# Patient Record
Sex: Male | Born: 1960 | Race: White | Hispanic: No | Marital: Married | Smoking: Never smoker
Health system: Southern US, Community
[De-identification: ages and names within clinical notes are randomized; demographics above are authoritative.]

## PROBLEM LIST (undated history)

## (undated) DIAGNOSIS — E559 Vitamin D deficiency, unspecified: Secondary | ICD-10-CM

## (undated) DIAGNOSIS — G4730 Sleep apnea, unspecified: Secondary | ICD-10-CM

## (undated) DIAGNOSIS — K760 Fatty (change of) liver, not elsewhere classified: Secondary | ICD-10-CM

## (undated) DIAGNOSIS — M109 Gout, unspecified: Secondary | ICD-10-CM

## (undated) DIAGNOSIS — M1990 Unspecified osteoarthritis, unspecified site: Secondary | ICD-10-CM

## (undated) DIAGNOSIS — E785 Hyperlipidemia, unspecified: Secondary | ICD-10-CM

## (undated) DIAGNOSIS — E669 Obesity, unspecified: Secondary | ICD-10-CM

## (undated) HISTORY — DX: Obesity, unspecified: E66.9

## (undated) HISTORY — DX: Unspecified osteoarthritis, unspecified site: M19.90

## (undated) HISTORY — DX: Hyperlipidemia, unspecified: E78.5

## (undated) HISTORY — DX: Gout, unspecified: M10.9

## (undated) HISTORY — PX: ORIF ACETABULAR FRACTURE: SHX5029

## (undated) HISTORY — PX: FRACTURE SURGERY: SHX138

## (undated) HISTORY — DX: Sleep apnea, unspecified: G47.30

## (undated) HISTORY — PX: HERNIA REPAIR: SHX51

## (undated) HISTORY — DX: Vitamin D deficiency, unspecified: E55.9

---

## 1997-08-20 ENCOUNTER — Other Ambulatory Visit: Admission: RE | Admit: 1997-08-20 | Discharge: 1997-08-20 | Payer: Self-pay | Admitting: *Deleted

## 2004-06-11 ENCOUNTER — Ambulatory Visit: Payer: Self-pay | Admitting: Internal Medicine

## 2010-06-02 ENCOUNTER — Ambulatory Visit: Payer: Self-pay | Admitting: Unknown Physician Specialty

## 2010-06-05 LAB — PATHOLOGY REPORT

## 2010-08-13 ENCOUNTER — Ambulatory Visit: Payer: Self-pay | Admitting: Anesthesiology

## 2010-08-15 ENCOUNTER — Ambulatory Visit: Payer: Self-pay | Admitting: Podiatry

## 2017-02-18 DIAGNOSIS — M1A00X0 Idiopathic chronic gout, unspecified site, without tophus (tophi): Secondary | ICD-10-CM | POA: Diagnosis not present

## 2017-02-18 DIAGNOSIS — G4730 Sleep apnea, unspecified: Secondary | ICD-10-CM | POA: Diagnosis not present

## 2017-02-18 DIAGNOSIS — M1991 Primary osteoarthritis, unspecified site: Secondary | ICD-10-CM | POA: Diagnosis not present

## 2017-02-18 DIAGNOSIS — E7849 Other hyperlipidemia: Secondary | ICD-10-CM | POA: Diagnosis not present

## 2017-02-18 DIAGNOSIS — Z125 Encounter for screening for malignant neoplasm of prostate: Secondary | ICD-10-CM | POA: Diagnosis not present

## 2017-02-25 DIAGNOSIS — M1991 Primary osteoarthritis, unspecified site: Secondary | ICD-10-CM | POA: Diagnosis not present

## 2017-02-25 DIAGNOSIS — E7849 Other hyperlipidemia: Secondary | ICD-10-CM | POA: Diagnosis not present

## 2017-02-25 DIAGNOSIS — Z0000 Encounter for general adult medical examination without abnormal findings: Secondary | ICD-10-CM | POA: Diagnosis not present

## 2017-02-25 DIAGNOSIS — G4730 Sleep apnea, unspecified: Secondary | ICD-10-CM | POA: Diagnosis not present

## 2017-03-10 DIAGNOSIS — L7211 Pilar cyst: Secondary | ICD-10-CM | POA: Diagnosis not present

## 2017-03-19 ENCOUNTER — Encounter (INDEPENDENT_AMBULATORY_CARE_PROVIDER_SITE_OTHER): Payer: Self-pay | Admitting: Vascular Surgery

## 2017-03-19 ENCOUNTER — Ambulatory Visit (INDEPENDENT_AMBULATORY_CARE_PROVIDER_SITE_OTHER): Payer: BLUE CROSS/BLUE SHIELD | Admitting: Vascular Surgery

## 2017-03-19 DIAGNOSIS — R600 Localized edema: Secondary | ICD-10-CM | POA: Diagnosis not present

## 2017-03-19 NOTE — Progress Notes (Signed)
Subjective:    Patient ID: Albert Fisher, male    DOB: 1960/05/20, 56 y.o.   MRN: [id number] Chief Complaint  Patient presents with  . Leg Swelling    Left leg edema   Presents as a new patient referred by Dr. Caryl Comes for evaluation of venous stasis.  The patient endorses a year long history of left lower extremity edema.  The patient states he sustained a trauma to the front of his left shin about a year ago.  Since this trauma, the patient has noticed his left lower extremity swells.  Notes the swelling is worse towards the end of the day.  The swelling is associated with some discomfort.  At this time, the patient does not engage in any conservative therapy.  The patient denies any surgery to the lower extremity.  The patient denies any DVT history to the lower extremity the patient denies any claudication-like symptoms, rest pain or ulceration to the lower extremity.  The patient denies any fever, nausea or vomiting.   Review of Systems  Constitutional: Negative.   HENT: Negative.   Eyes: Negative.   Respiratory: Negative.   Cardiovascular:       Left lower extremity swelling  Gastrointestinal: Negative.   Endocrine: Negative.   Genitourinary: Negative.   Musculoskeletal: Negative.   Skin: Negative.   Allergic/Immunologic: Negative.   Neurological: Negative.   Hematological: Negative.   Psychiatric/Behavioral: Negative.       Objective:   Physical Exam  Constitutional: He is oriented to person, place, and time. He appears well-developed and well-nourished. No distress.  HENT:  Head: Normocephalic and atraumatic.  Eyes: Conjunctivae are normal. Pupils are equal, round, and reactive to light.  Neck: Normal range of motion.  Cardiovascular: Normal rate, regular rhythm, normal heart sounds and intact distal pulses.  Pulses:      Radial pulses are 2+ on the right side, and 2+ on the left side.  Hard to palpate pedal pulses due to edema and body habitus.  The bilateral feet are  warm.  Pulmonary/Chest: Effort normal and breath sounds normal.  Musculoskeletal: Normal range of motion. He exhibits edema (Moderate left lower nonpitting edema noted.  Right lower extremity edema is mild).  Neurological: He is alert and oriented to person, place, and time.  Skin: Skin is warm and dry. He is not diaphoretic.  There is mild stasis dermatitis to the bilateral lower extremity.  There is no cellulitis.  There is no skin changes.  Psychiatric: He has a normal mood and affect. His behavior is normal. Judgment and thought content normal.  Vitals reviewed.  BP (!) 148/90   Pulse 71   Resp 17   Ht 5\' 11"  (1.803 m)   Wt 300 lb (136.1 kg)   BMI 41.84 kg/m   Past Medical History:  Diagnosis Date  . Gout   . Hyperlipidemia   . Obesity   . Osteoarthritis   . Sleep apnea   . Vitamin D deficiency    Social History   Socioeconomic History  . Marital status: Married    Spouse name: Not on file  . Number of children: 0  . Years of education: Not on file  . Highest education level: Not on file  Social Needs  . Financial resource strain: Not on file  . Food insecurity - worry: Not on file  . Food insecurity - inability: Not on file  . Transportation needs - medical: Not on file  . Transportation needs - non-medical:  Not on file  Occupational History  . Not on file  Tobacco Use  . Smoking status: Never Smoker  . Smokeless tobacco: Never Used  Substance and Sexual Activity  . Alcohol use: No    Frequency: Never  . Drug use: No  . Sexual activity: No  Other Topics Concern  . Not on file  Social History Narrative  . Not on file   Past Surgical History:  Procedure Laterality Date  . HERNIA REPAIR    . ORIF ACETABULAR FRACTURE Left    5th metatarsal   Family History  Problem Relation Age of Onset  . Hypertension Mother   . Irritable bowel syndrome Father   . Gout Brother    No Known Allergies     Assessment & Plan:  Presents as a new patient referred by  Dr. Caryl Comes for evaluation of venous stasis.  The patient endorses a year long history of left lower extremity edema.  The patient states he sustained a trauma to the front of his left shin about a year ago.  Since this trauma, the patient has noticed his left lower extremity swells.  Notes the swelling is worse towards the end of the day.  The swelling is associated with some discomfort.  At this time, the patient does not engage in any conservative therapy.  The patient denies any surgery to the lower extremity.  The patient denies any DVT history to the lower extremity the patient denies any claudication-like symptoms, rest pain or ulceration to the lower extremity.  The patient denies any fever, nausea or vomiting.  1. Bilateral lower extremity edema - New The patient was encouraged to wear graduated compression stockings (20-30 mmHg) on a daily basis. The patient was instructed to begin wearing the stockings first thing in the morning and removing them in the evening. The patient was instructed specifically not to sleep in the stockings. Prescription given. In addition, behavioral modification including elevation during the day will be initiated. Anti-inflammatories for pain. I would like to see the patient back in 1 month and have him undergo a bilateral venous duplex to rule out any contributing reflux. At this time, the patient would like to just start conservative therapy.  If conservative therapy does not improve his symptoms he will call our office and at that point we will move forward with a duplex. Follow-up as needed The patient was instructed to call the office in the interim if any worsening edema or ulcerations to the legs, feet or toes occurs. The patient expresses their understanding.  Current Outpatient Medications on File Prior to Visit  Medication Sig Dispense Refill  . allopurinol (ZYLOPRIM) 300 MG tablet Take 300 mg daily by mouth.    . montelukast (SINGULAIR) 10 MG tablet Take  10 mg 1 day or 1 dose by mouth.     No current facility-administered medications on file prior to visit.     There are no Patient Instructions on file for this visit. No Follow-up on file.   Albert Fisher A [name], PA-C

## 2017-05-27 DIAGNOSIS — L7211 Pilar cyst: Secondary | ICD-10-CM | POA: Diagnosis not present

## 2017-05-27 DIAGNOSIS — D170 Benign lipomatous neoplasm of skin and subcutaneous tissue of head, face and neck: Secondary | ICD-10-CM | POA: Diagnosis not present

## 2018-03-01 DIAGNOSIS — E559 Vitamin D deficiency, unspecified: Secondary | ICD-10-CM | POA: Diagnosis not present

## 2018-03-01 DIAGNOSIS — G4730 Sleep apnea, unspecified: Secondary | ICD-10-CM | POA: Diagnosis not present

## 2018-03-01 DIAGNOSIS — E7849 Other hyperlipidemia: Secondary | ICD-10-CM | POA: Diagnosis not present

## 2018-03-01 DIAGNOSIS — Z125 Encounter for screening for malignant neoplasm of prostate: Secondary | ICD-10-CM | POA: Diagnosis not present

## 2018-03-01 DIAGNOSIS — M1A00X0 Idiopathic chronic gout, unspecified site, without tophus (tophi): Secondary | ICD-10-CM | POA: Diagnosis not present

## 2018-03-08 DIAGNOSIS — M1991 Primary osteoarthritis, unspecified site: Secondary | ICD-10-CM | POA: Diagnosis not present

## 2018-03-08 DIAGNOSIS — E559 Vitamin D deficiency, unspecified: Secondary | ICD-10-CM | POA: Diagnosis not present

## 2018-03-08 DIAGNOSIS — M1A00X0 Idiopathic chronic gout, unspecified site, without tophus (tophi): Secondary | ICD-10-CM | POA: Diagnosis not present

## 2018-03-08 DIAGNOSIS — E7849 Other hyperlipidemia: Secondary | ICD-10-CM | POA: Diagnosis not present

## 2019-03-07 DIAGNOSIS — E7849 Other hyperlipidemia: Secondary | ICD-10-CM | POA: Diagnosis not present

## 2019-03-07 DIAGNOSIS — Z0000 Encounter for general adult medical examination without abnormal findings: Secondary | ICD-10-CM | POA: Diagnosis not present

## 2019-03-07 DIAGNOSIS — E559 Vitamin D deficiency, unspecified: Secondary | ICD-10-CM | POA: Diagnosis not present

## 2019-03-07 DIAGNOSIS — G4730 Sleep apnea, unspecified: Secondary | ICD-10-CM | POA: Diagnosis not present

## 2019-03-07 DIAGNOSIS — M1991 Primary osteoarthritis, unspecified site: Secondary | ICD-10-CM | POA: Diagnosis not present

## 2019-03-07 DIAGNOSIS — M1A00X0 Idiopathic chronic gout, unspecified site, without tophus (tophi): Secondary | ICD-10-CM | POA: Diagnosis not present

## 2019-03-14 DIAGNOSIS — M1991 Primary osteoarthritis, unspecified site: Secondary | ICD-10-CM | POA: Diagnosis not present

## 2019-03-14 DIAGNOSIS — Z0000 Encounter for general adult medical examination without abnormal findings: Secondary | ICD-10-CM | POA: Diagnosis not present

## 2019-03-14 DIAGNOSIS — E7849 Other hyperlipidemia: Secondary | ICD-10-CM | POA: Diagnosis not present

## 2019-03-14 DIAGNOSIS — G4730 Sleep apnea, unspecified: Secondary | ICD-10-CM | POA: Diagnosis not present

## 2019-04-13 DIAGNOSIS — R748 Abnormal levels of other serum enzymes: Secondary | ICD-10-CM | POA: Diagnosis not present

## 2019-05-02 ENCOUNTER — Other Ambulatory Visit: Payer: Self-pay | Admitting: Internal Medicine

## 2019-05-02 DIAGNOSIS — R748 Abnormal levels of other serum enzymes: Secondary | ICD-10-CM

## 2019-05-30 ENCOUNTER — Other Ambulatory Visit: Payer: Self-pay

## 2019-05-30 ENCOUNTER — Ambulatory Visit
Admission: RE | Admit: 2019-05-30 | Discharge: 2019-05-30 | Disposition: A | Discharge status: Home / Self Care | Payer: BLUE CROSS/BLUE SHIELD | Source: Ambulatory Visit | Attending: Internal Medicine | Admitting: Internal Medicine

## 2019-05-30 DIAGNOSIS — R748 Abnormal levels of other serum enzymes: Secondary | ICD-10-CM | POA: Insufficient documentation

## 2020-07-08 IMAGING — US US ABDOMEN LIMITED
1 series · 14 of 25 positions shown · non-contrast
Comparison: None.

CLINICAL DATA: Elevated liver enzymes

EXAM:
ULTRASOUND ABDOMEN LIMITED RIGHT UPPER QUADRANT

[Series 1: us abdomen limited · 14 of 34 slices shown]
[im 1/34]
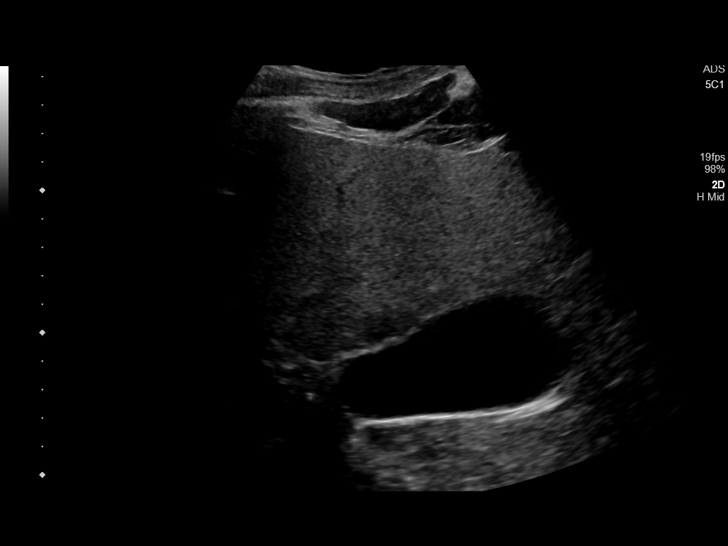
[im 3/34]
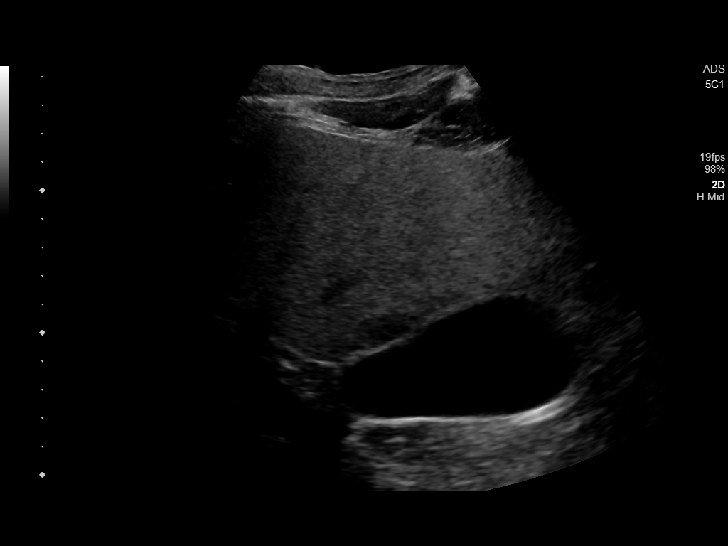
[im 6/34]
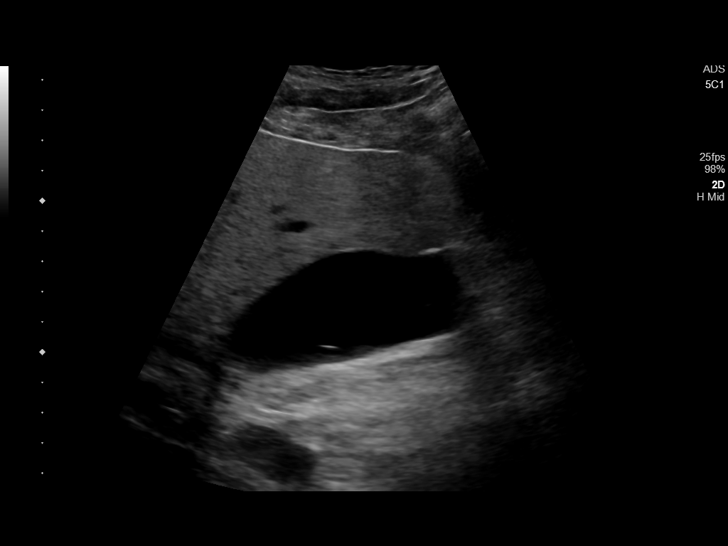
[im 9/34]
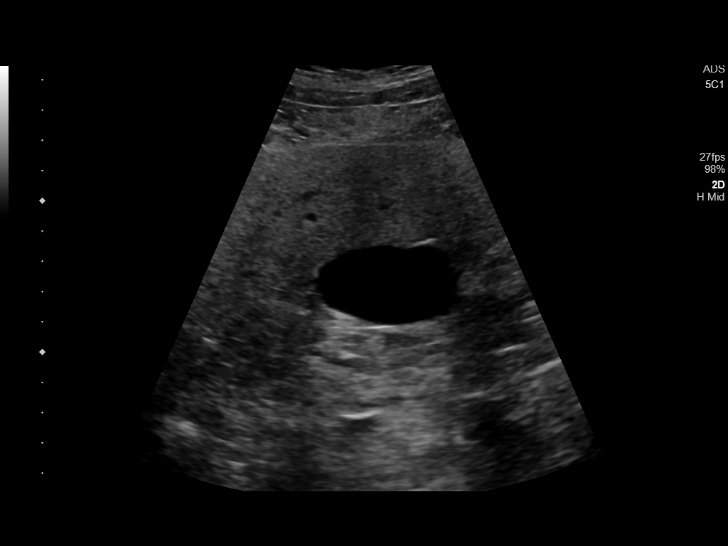
[im 12/34]
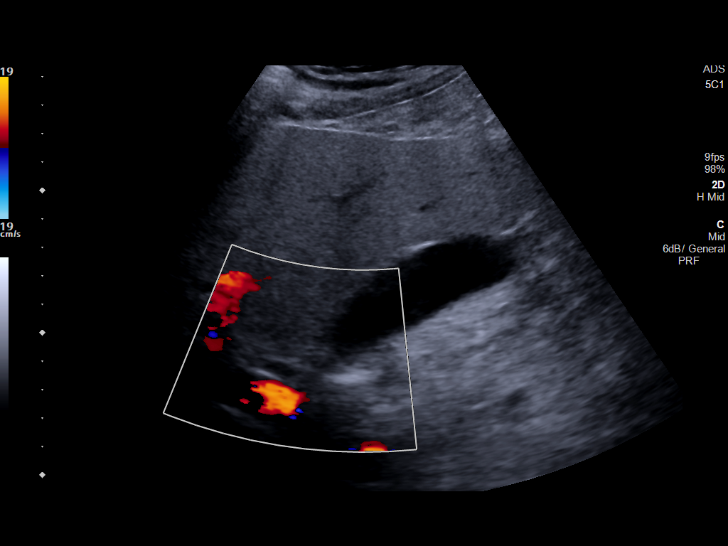
[im 13/34]
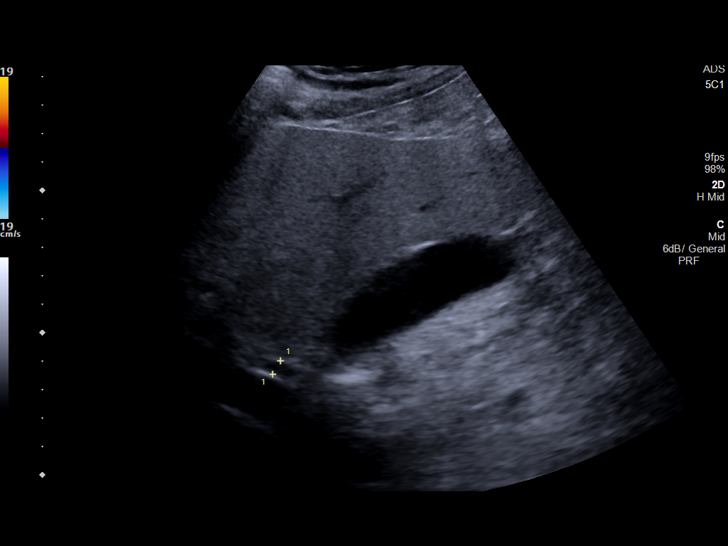
[im 16/34]
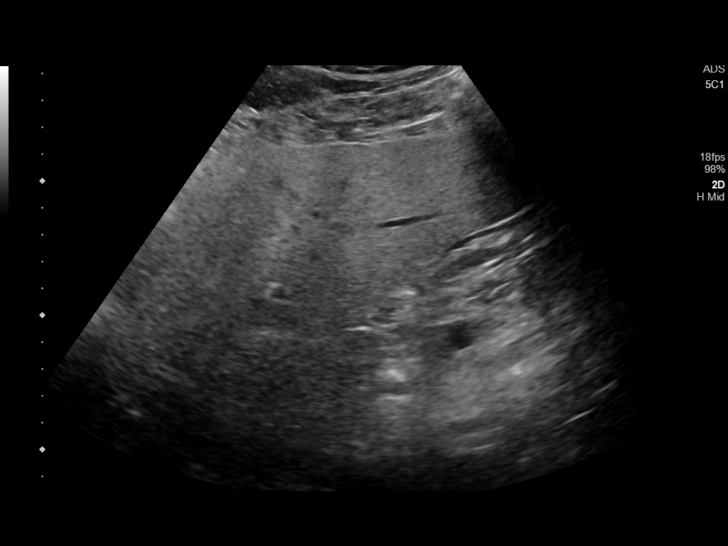
[im 18/34]
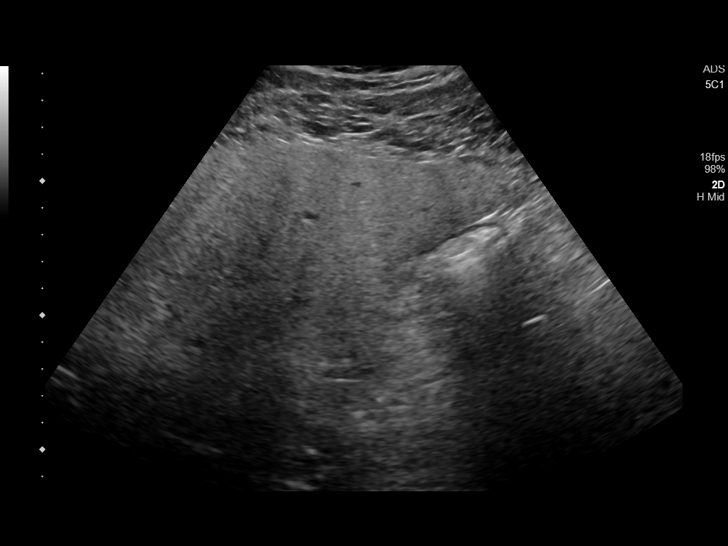
[im 21/34]
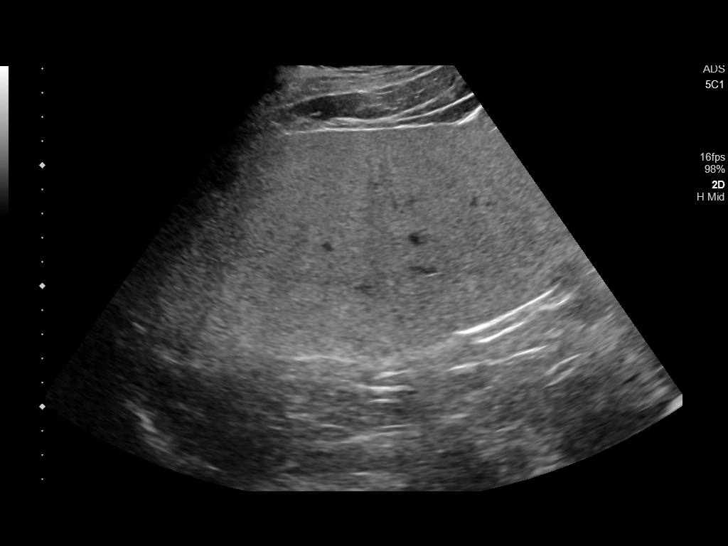
[im 23/34]
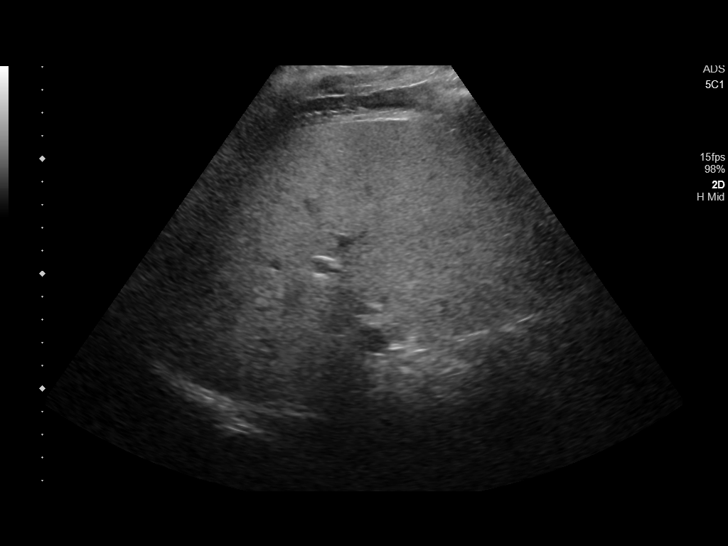
[im 25/34]
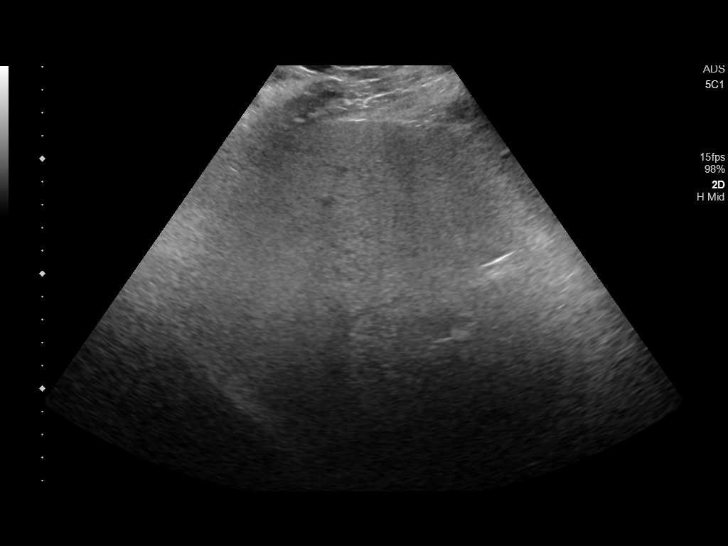
[im 28/34]
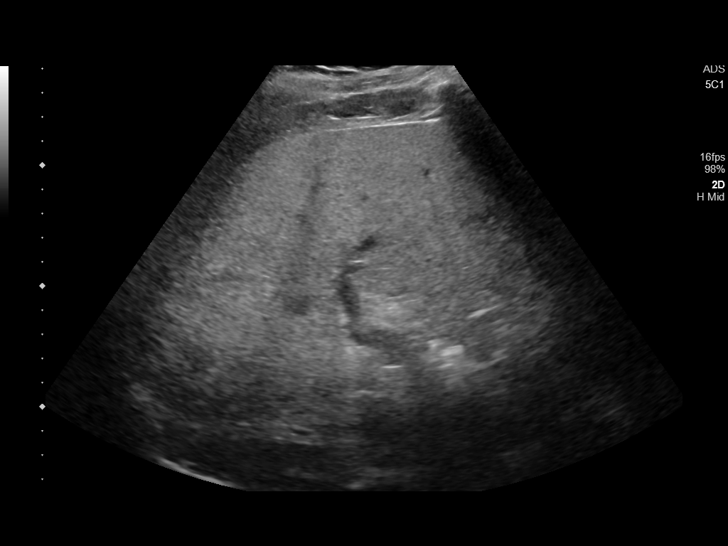
[im 31/34]
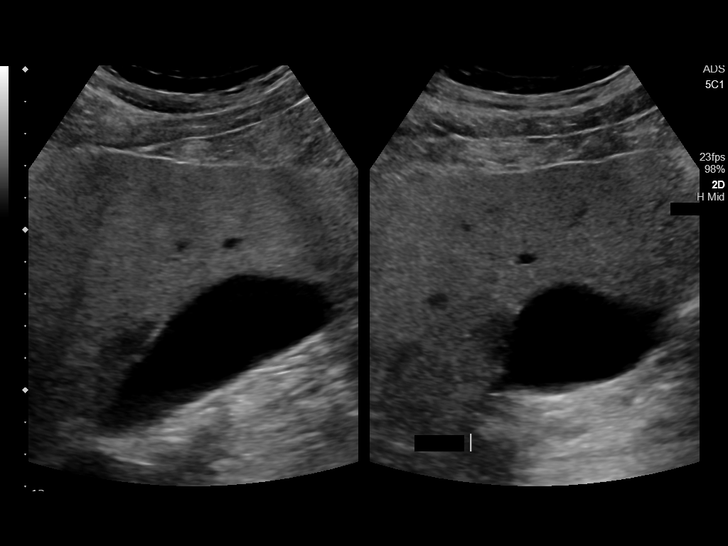
[im 34/34]
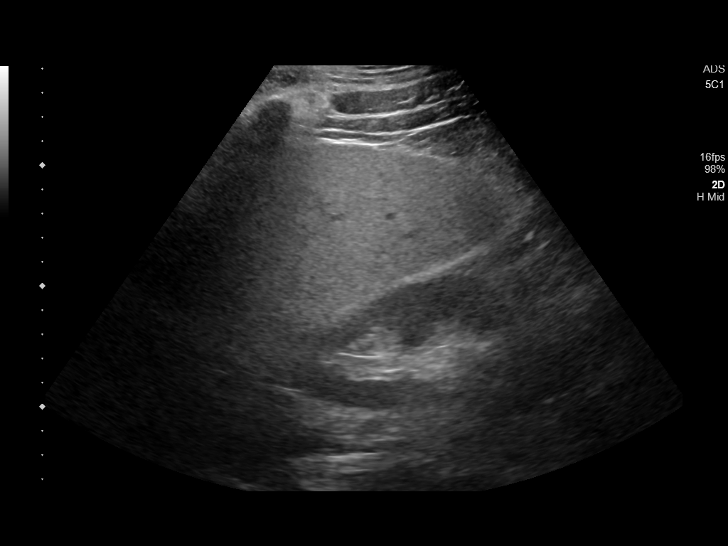

[14 of 25 positions shown; findings below may reference images not displayed]

FINDINGS: Gallbladder:

No gallstones or wall thickening visualized. There is no
pericholecystic fluid. No sonographic Murphy sign noted by
sonographer.

Common bile duct:

Diameter: 6 mm. No intrahepatic or extrahepatic biliary duct
dilatation.

Liver:

No focal lesion identified. Liver echogenicity is increased
diffusely. There is a hypoechoic area adjacent to the gallbladder
fossa measuring 1.5 x 1.8 x 1.3 cm which likely represents localized
focal fatty sparing. Portal vein is patent on color Doppler imaging
with normal direction of blood flow towards the liver.

Other: None.
IMPRESSION: 1. Diffuse increase in liver echogenicity, a finding indicative of
hepatic steatosis. Suspect focal fatty sparing near the gallbladder
fossa. Beyond suspected focal fatty sparing, no focal liver lesions
evident. Note that the sensitivity of ultrasound for detection of
focal liver lesions is diminished given this degree of increased
echogenicity from apparent hepatic steatosis.

2.  Study otherwise unremarkable.

## 2020-10-14 ENCOUNTER — Encounter: Payer: Self-pay | Admitting: *Deleted

## 2020-10-15 ENCOUNTER — Ambulatory Visit: Payer: BC Managed Care – PPO | Admitting: Certified Registered Nurse Anesthetist

## 2020-10-15 ENCOUNTER — Ambulatory Visit
Admission: RE | Admit: 2020-10-15 | Discharge: 2020-10-15 | Disposition: A | Discharge status: Home / Self Care | Payer: BC Managed Care – PPO | Attending: Gastroenterology | Admitting: Gastroenterology

## 2020-10-15 ENCOUNTER — Encounter
Admission: RE | Disposition: A | Discharge status: Home / Self Care | Payer: Self-pay | Source: Home / Self Care | Attending: Gastroenterology

## 2020-10-15 ENCOUNTER — Encounter: Payer: Self-pay | Admitting: *Deleted

## 2020-10-15 DIAGNOSIS — Z888 Allergy status to other drugs, medicaments and biological substances status: Secondary | ICD-10-CM | POA: Diagnosis not present

## 2020-10-15 DIAGNOSIS — E669 Obesity, unspecified: Secondary | ICD-10-CM | POA: Diagnosis not present

## 2020-10-15 DIAGNOSIS — D123 Benign neoplasm of transverse colon: Secondary | ICD-10-CM | POA: Insufficient documentation

## 2020-10-15 DIAGNOSIS — Z7982 Long term (current) use of aspirin: Secondary | ICD-10-CM | POA: Diagnosis not present

## 2020-10-15 DIAGNOSIS — Z1211 Encounter for screening for malignant neoplasm of colon: Secondary | ICD-10-CM | POA: Diagnosis present

## 2020-10-15 DIAGNOSIS — E785 Hyperlipidemia, unspecified: Secondary | ICD-10-CM | POA: Diagnosis not present

## 2020-10-15 DIAGNOSIS — D125 Benign neoplasm of sigmoid colon: Secondary | ICD-10-CM | POA: Diagnosis not present

## 2020-10-15 DIAGNOSIS — Z79899 Other long term (current) drug therapy: Secondary | ICD-10-CM | POA: Insufficient documentation

## 2020-10-15 DIAGNOSIS — K760 Fatty (change of) liver, not elsewhere classified: Secondary | ICD-10-CM | POA: Diagnosis not present

## 2020-10-15 DIAGNOSIS — Z6836 Body mass index (BMI) 36.0-36.9, adult: Secondary | ICD-10-CM | POA: Diagnosis not present

## 2020-10-15 DIAGNOSIS — G4730 Sleep apnea, unspecified: Secondary | ICD-10-CM | POA: Insufficient documentation

## 2020-10-15 DIAGNOSIS — K640 First degree hemorrhoids: Secondary | ICD-10-CM | POA: Insufficient documentation

## 2020-10-15 HISTORY — PX: COLONOSCOPY WITH PROPOFOL: SHX5780

## 2020-10-15 HISTORY — DX: Fatty (change of) liver, not elsewhere classified: K76.0

## 2020-10-15 SURGERY — COLONOSCOPY WITH PROPOFOL
Anesthesia: General

## 2020-10-15 MED ORDER — PROPOFOL 10 MG/ML IV BOLUS
INTRAVENOUS | Status: AC
  Filled 2020-10-15: qty 20

## 2020-10-15 MED ORDER — SODIUM CHLORIDE 0.9 % IV SOLN: INTRAVENOUS | Status: DC

## 2020-10-15 MED ORDER — PROPOFOL 500 MG/50ML IV EMUL
INTRAVENOUS | Status: DC | PRN
  Administered 2020-10-15: 125 ug/kg/min via INTRAVENOUS

## 2020-10-15 MED ORDER — SPOT INK MARKER SYRINGE KIT
PACK | SUBMUCOSAL | Status: DC | PRN
  Administered 2020-10-15: 1.5 mL via SUBMUCOSAL

## 2020-10-15 MED ORDER — PROPOFOL 10 MG/ML IV BOLUS
INTRAVENOUS | Status: DC | PRN
  Administered 2020-10-15: 50 mg via INTRAVENOUS

## 2020-10-15 MED ORDER — LIDOCAINE HCL (CARDIAC) PF 100 MG/5ML IV SOSY
PREFILLED_SYRINGE | INTRAVENOUS | Status: DC | PRN
  Administered 2020-10-15: 40 mg via INTRAVENOUS

## 2020-10-15 NOTE — Op Note (Signed)
[facility] Gastroenterology Patient Name: Albert Fisher Procedure Date: 10/15/2020 9:43 AM MRN: [id number] Account #: [id number] Date of Birth: [birth year] Admit Type: Outpatient Age: 60 Room: [facility] ENDO ROOM 2 Gender: Male Note Status: Finalized Procedure:             Colonoscopy Indications:           Screening for colorectal malignant neoplasm Providers:             Andrey Farmer MD, MD Referring MD:          Ramonita Lab, MD (Referring MD) Medicines:             Monitored Anesthesia Care Complications:         No immediate complications. Estimated blood loss:                         Minimal. Procedure:             Pre-Anesthesia Assessment:                        - Prior to the procedure, a History and Physical was                         performed, and patient medications and allergies were                         reviewed. The patient is competent. The risks and                         benefits of the procedure and the sedation options and                         risks were discussed with the patient. All questions                         were answered and informed consent was obtained.                         Patient identification and proposed procedure were                         verified by the physician, the nurse, the anesthetist                         and the technician in the endoscopy suite. Mental                         Status Examination: alert and oriented. Airway                         Examination: normal oropharyngeal airway and neck                         mobility. Respiratory Examination: clear to                         auscultation. CV Examination: normal. Prophylactic  Antibiotics: The patient does not require prophylactic                         antibiotics. Prior Anticoagulants: The patient has                         taken no previous anticoagulant or antiplatelet                         agents. ASA Grade  Assessment: II - A patient with mild                         systemic disease. After reviewing the risks and                         benefits, the patient was deemed in satisfactory                         condition to undergo the procedure. The anesthesia                         plan was to use monitored anesthesia care (MAC).                         Immediately prior to administration of medications,                         the patient was re-assessed for adequacy to receive                         sedatives. The heart rate, respiratory rate, oxygen                         saturations, blood pressure, adequacy of pulmonary                         ventilation, and response to care were monitored                         throughout the procedure. The physical status of the                         patient was re-assessed after the procedure.                        After obtaining informed consent, the colonoscope was                         passed under direct vision. Throughout the procedure,                         the patient's blood pressure, pulse, and oxygen                         saturations were monitored continuously. The                         Colonoscope was introduced through the anus and  advanced to the the cecum, identified by appendiceal                         orifice and ileocecal valve. The colonoscopy was                         performed without difficulty. The patient tolerated                         the procedure well. The quality of the bowel                         preparation was good. Findings:      The perianal and digital rectal examinations were normal.      A 20 mm polyp was found in the sigmoid colon. The polyp was       pedunculated. The polyp was removed with a hot snare. Resection and       retrieval were complete. Area was tattooed with an injection of [place]       ink. To prevent bleeding post-intervention, one hemostatic clip was        successfully placed. There was no bleeding at the end of the procedure.       Biopsies were taken with a cold forceps for histology of the residual       stalk. Estimated blood loss was minimal.      A 7 mm polyp was found in the transverse colon. The polyp was sessile.       The polyp was removed with a hot snare. Resection and retrieval were       complete. Estimated blood loss was minimal.      Internal hemorrhoids were found during retroflexion. The hemorrhoids       were Grade I (internal hemorrhoids that do not prolapse).      The exam was otherwise without abnormality on direct and retroflexion       views. Impression:            - One 20 mm polyp in the sigmoid colon, removed with a                         hot snare. Resected and retrieved. Tattooed. Clip was                         placed. Biopsied.                        - One 7 mm polyp in the transverse colon, removed with                         a hot snare. Resected and retrieved.                        - Internal hemorrhoids.                        - The examination was otherwise normal on direct and                         retroflexion views. Recommendation:        - Discharge patient to home.                        -  Resume previous diet.                        - Continue present medications.                        - Await pathology results.                        - Repeat colonoscopy for surveillance based on                         pathology results.                        - Return to referring physician as previously                         scheduled. Procedure Code(s):     --- Professional ---                        [phone], Colonoscopy, flexible; with removal of                         tumor(s), polyp(s), or other lesion(s) by snare                         technique                        [id number], Colonoscopy, flexible; with directed submucosal                         injection(s), any substance Diagnosis  Code(s):     --- Professional ---                        Z12.11, Encounter for screening for malignant neoplasm                         of colon                        K63.5, Polyp of colon                        K64.0, First degree hemorrhoids CPT copyright 2019 American Medical Association. All rights reserved. The codes documented in this report are preliminary and upon coder review may  be revised to meet current compliance requirements. Andrey Farmer MD, MD 10/15/2020 10:28:43 AM Number of Addenda: 0 Note Initiated On: 10/15/2020 9:43 AM Scope Withdrawal Time: 0 hours 5 minutes 30 seconds  Total Procedure Duration: 0 hours 31 minutes 3 seconds  Estimated Blood Loss:  Estimated blood loss was minimal.      [facility]

## 2020-10-15 NOTE — Transfer of Care (Signed)
Immediate Anesthesia Transfer of Care Note  Patient: Albert Fisher  Procedure(s) Performed: COLONOSCOPY WITH PROPOFOL (N/A )  Patient Location: PACU  Anesthesia Type:General  Level of Consciousness: awake, alert  and oriented  Airway & Oxygen Therapy: Patient Spontanous Breathing and Patient connected to nasal cannula oxygen  Post-op Assessment: Report given to RN and Post -op Vital signs reviewed and stable  Post vital signs: Reviewed and stable  Last Vitals:  Vitals Value Taken Time  BP 133/86 10/15/20 1030  Temp 36.4 C 10/15/20 1029  Pulse 78 10/15/20 1032  Resp 22 10/15/20 1032  SpO2 96 % 10/15/20 1032  Vitals shown include unvalidated device data.  Last Pain:  Vitals:   10/15/20 1029  TempSrc: Temporal  PainSc: 0-No pain         Complications: No complications documented.

## 2020-10-15 NOTE — Anesthesia Postprocedure Evaluation (Signed)
Anesthesia Post Note  Patient: Albert Fisher  Procedure(s) Performed: COLONOSCOPY WITH PROPOFOL (N/A )  Patient location during evaluation: Endoscopy Anesthesia Type: General Level of consciousness: awake and alert Pain management: pain level controlled Vital Signs Assessment: post-procedure vital signs reviewed and stable Respiratory status: spontaneous breathing, nonlabored ventilation, respiratory function stable and patient connected to nasal cannula oxygen Cardiovascular status: blood pressure returned to baseline and stable Postop Assessment: no apparent nausea or vomiting Anesthetic complications: no   No complications documented.   Last Vitals:  Vitals:   10/15/20 1039 10/15/20 1049  BP: (!) 137/92 136/86  Pulse:    Resp:    Temp:    SpO2:      Last Pain:  Vitals:   10/15/20 1049  TempSrc:   PainSc: 0-No pain                 Arita Miss

## 2020-10-15 NOTE — H&P (Signed)
Outpatient short stay form Pre-procedure 10/15/2020 9:41 AM Raylene Miyamoto MD, MPH  Primary Physician: Dr. Caryl Comes  Reason for visit:  Screening  History of present illness:   60 y/o gentleman with history of obesity here for screening colonoscopy. No family history of GI malignancies. No blood thinners. No abdominal surgeries. No new symptoms.    Current Facility-Administered Medications:  .  0.9 %  sodium chloride infusion, , Intravenous, Continuous, Almon Whitford, Hilton Cork, MD, Last Rate: 20 mL/hr at 10/15/20 0935, New Bag at 10/15/20 0935  Medications Prior to Admission  Medication Sig Dispense Refill Last Dose  . aspirin EC 81 MG tablet Take 81 mg by mouth daily. Swallow whole.   Past Week at Unknown time  . azelastine (ASTELIN) 0.1 % nasal spray Place 1 spray into both nostrils 2 (two) times daily. Use in each nostril as directed     . montelukast (SINGULAIR) 10 MG tablet Take 10 mg 1 day or 1 dose by mouth.   10/14/2020 at Unknown time  . Vitamin D, Ergocalciferol, (DRISDOL) 1.25 MG (50000 UNIT) CAPS capsule Take 50,000 Units by mouth every 7 (seven) days.   Past Month at Unknown time  . allopurinol (ZYLOPRIM) 300 MG tablet Take 300 mg daily by mouth.   10/13/2020     Allergies  Allergen Reactions  . Fenofibrate Other (See Comments)    Muscle pain     Past Medical History:  Diagnosis Date  . Gout   . Hepatic steatosis   . Hyperlipidemia   . Obesity   . Osteoarthritis   . Sleep apnea   . Vitamin D deficiency     Review of systems:  Otherwise negative.    Physical Exam  Gen: Alert, oriented. Appears stated age.  HEENT: PERRLA. Lungs: No respiratory distress CV: RRR Abd: soft, benign, no masses Ext: No edema    Planned procedures: Proceed with colonoscopy. The patient understands the nature of the planned procedure, indications, risks, alternatives and potential complications including but not limited to bleeding, infection, perforation, damage to internal organs  and possible oversedation/side effects from anesthesia. The patient agrees and gives consent to proceed.  Please refer to procedure notes for findings, recommendations and patient disposition/instructions.     Raylene Miyamoto MD, MPH Gastroenterology 10/15/2020  9:41 AM

## 2020-10-15 NOTE — Anesthesia Preprocedure Evaluation (Signed)
Anesthesia Evaluation  Patient identified by MRN, date of birth, ID band Patient awake    Reviewed: Allergy & Precautions, NPO status , Patient's Chart, lab work & pertinent test results  History of Anesthesia Complications Negative for: history of anesthetic complications  Airway Mallampati: III  TM Distance: >3 FB Neck ROM: Full    Dental no notable dental hx. (+) Teeth Intact   Pulmonary sleep apnea and Continuous Positive Airway Pressure Ventilation , neg COPD, Patient abstained from smoking.Not current smoker,    Pulmonary exam normal breath sounds clear to auscultation       Cardiovascular Exercise Tolerance: Good METS(-) hypertension(-) CAD and (-) Past MI negative cardio ROS  (-) dysrhythmias  Rhythm:Regular Rate:Normal - Systolic murmurs    Neuro/Psych negative neurological ROS  negative psych ROS   GI/Hepatic neg GERD  ,(+)     (-) substance abuse  ,   Endo/Other  neg diabetes  Renal/GU negative Renal ROS     Musculoskeletal   Abdominal   Peds  Hematology   Anesthesia Other Findings Past Medical History: No date: Gout No date: Hepatic steatosis No date: Hyperlipidemia No date: Obesity No date: Osteoarthritis No date: Sleep apnea No date: Vitamin D deficiency  Reproductive/Obstetrics                             Anesthesia Physical Anesthesia Plan  ASA: II  Anesthesia Plan: General   Post-op Pain Management:    Induction: Intravenous  PONV Risk Score and Plan: 2 and Ondansetron, Propofol infusion and TIVA  Airway Management Planned: Nasal Cannula  Additional Equipment: None  Intra-op Plan:   Post-operative Plan:   Informed Consent: I have reviewed the patients History and Physical, chart, labs and discussed the procedure including the risks, benefits and alternatives for the proposed anesthesia with the patient or authorized representative who has indicated  his/her understanding and acceptance.     Dental advisory given  Plan Discussed with: CRNA and Surgeon  Anesthesia Plan Comments: (Discussed risks of anesthesia with patient, including possibility of difficulty with spontaneous ventilation under anesthesia necessitating airway intervention, PONV, and rare risks such as cardiac or respiratory or neurological events. Patient understands.)        Anesthesia Quick Evaluation

## 2020-10-15 NOTE — Interval H&P Note (Signed)
History and Physical Interval Note:  10/15/2020 9:43 AM  Albert Fisher  has presented today for surgery, with the diagnosis of SCREENING.  The various methods of treatment have been discussed with the patient and family. After consideration of risks, benefits and other options for treatment, the patient has consented to  Procedure(s): COLONOSCOPY WITH PROPOFOL (N/A) as a surgical intervention.  The patient's history has been reviewed, patient examined, no change in status, stable for surgery.  I have reviewed the patient's chart and labs.  Questions were answered to the patient's satisfaction.     Lesly Rubenstein  Ok to proceed with colonoscopy

## 2020-10-16 ENCOUNTER — Encounter: Payer: Self-pay | Admitting: Gastroenterology

## 2020-10-16 LAB — SURGICAL PATHOLOGY

## 2021-12-08 ENCOUNTER — Ambulatory Visit: Payer: Self-pay | Admitting: Surgery

## 2021-12-08 NOTE — H&P (Signed)
Subjective:  CC: Recurrent umbilical hernia [G62.6]  HPI:  Albert Fisher is a 61 y.o. male who was referred by Cheryll Cockayne, MD for evaluation of above. Symptoms were first noted a few weeks ago. Pain is discomfort, confined to the periumbilical area, without radiation.  Associated with nothing, exacerbated by nothing  Lump is reducible.   Hx of repair in past with mesh per patient report.   Past Medical History:  has a past medical history of Gout, Hepatic steatosis (05/31/2019), HLD (hyperlipidemia), Obesity, Osteoarthritis, Sleep apnea with use of continuous positive airway pressure (CPAP), and Vitamin D deficiency disease.  Past Surgical History:  Past Surgical History: Procedure Laterality Date  COLONOSCOPY  06/02/2010  Dr. Kennis Carina @ [facility] - Hyperplastic Polyp, rpt 10 yrs per RTE  COLONOSCOPY  10/15/2020  Tubular adenomas/29yr/[facility]  HERNIA REPAIR    ORIF left 5th metatarsal fracture     Family History: family history includes Gout in his brother; High blood pressure (Hypertension) in his mother; Irritable bowel syndrome in his father.  Social History:  reports that he has never smoked. He has never used smokeless tobacco. He reports that he does not drink alcohol. No history on file for drug use.  Current Medications: has a current medication list which includes the following prescription(s): allopurinol, aspirin, ergocalciferol (vitamin d2), montelukast, prednisone, and sodium, potassium, and magnesium.  Allergies:  Allergies as of 12/08/2021 - Reviewed 12/08/2021 Allergen Reaction Noted  Fenofibrate Muscle Pain    ROS:  A 15 point review of systems was performed and pertinent positives and negatives noted in HPI   Objective:    BP (!) 153/83   Pulse 81   Ht 185.4 cm ('6\' 1"'$ )   Wt (!) 132 kg (291 lb)   BMI 38.39 kg/m   Constitutional :  Alert, cooperative, no distress Lymphatics/Throat:  Supple, no lymphadenopathy Respiratory:  clear to auscultation  bilaterally Cardiovascular:  regular rate and rhythm Gastrointestinal: soft, non-tender; bowel sounds normal; no masses,  no organomegaly. umbilical hernia noted.  moderate, incarcerated, no overlying skin changes, and rare TTP . Diastasis noted as well. Musculoskeletal: Steady gait and movement Skin: Cool and moist, periumbilical surgical scar Psychiatric: Normal affect, non-agitated, not confused     LABS:  N/a   RADS: N/a Assessment:      Recurrent umbilical hernia [[R48.5] pt concerned about strangulation and requesting proceeding with repair, understanding he is higher risk of recurrence at his current weight.  Plan:    1. Recurrent umbilical hernia [[I62.7]  Discussed the risk of surgery including recurrence, which can be up to 50% in the case of incisional or complex hernias, possible use of prosthetic materials (mesh) and the increased risk of mesh infxn if used, bleeding, chronic pain, post-op infxn, post-op SBO or ileus, and possible re-operation to address said risks. The risks of general anesthetic, if used, includes MI, CVA, sudden death or even reaction to anesthetic medications also discussed. Alternatives include continued observation.  Benefits include possible symptom relief, prevention of incarceration, strangulation, enlargement in size over time, and the risk of emergency surgery in the face of strangulation.   Typical post-op recovery time of 3-5 days with 2 weeks of activity restrictions were also discussed.  ED return precautions given for sudden increase in pain, size of hernia with accompanying fever, nausea, and/or vomiting.  The patient verbalized understanding and all questions were answered to the patient's satisfaction.   2. Patient has elected to proceed with surgical treatment. Procedure will  be scheduled. robotic assisted laparoscopic with mesh  labs/images/medications/previous chart entries reviewed personally and relevant changes/updates noted  above.

## 2021-12-08 NOTE — H&P (View-Only) (Signed)
Subjective:  CC: Recurrent umbilical hernia [G81.8]  HPI:  Albert Fisher is a 61 y.o. male who was referred by Cheryll Cockayne, MD for evaluation of above. Symptoms were first noted a few weeks ago. Pain is discomfort, confined to the periumbilical area, without radiation.  Associated with nothing, exacerbated by nothing  Lump is reducible.   Hx of repair in past with mesh per patient report.   Past Medical History:  has a past medical history of Gout, Hepatic steatosis (05/31/2019), HLD (hyperlipidemia), Obesity, Osteoarthritis, Sleep apnea with use of continuous positive airway pressure (CPAP), and Vitamin D deficiency disease.  Past Surgical History:  Past Surgical History: Procedure Laterality Date  COLONOSCOPY  06/02/2010  Dr. Kennis Carina @ [place] - Hyperplastic Polyp, rpt 10 yrs per RTE  COLONOSCOPY  10/15/2020  Tubular adenomas/21yr/[facility]  HERNIA REPAIR    ORIF left 5th metatarsal fracture     Family History: family history includes Gout in his brother; High blood pressure (Hypertension) in his mother; Irritable bowel syndrome in his father.  Social History:  reports that he has never smoked. He has never used smokeless tobacco. He reports that he does not drink alcohol. No history on file for drug use.  Current Medications: has a current medication list which includes the following prescription(s): allopurinol, aspirin, ergocalciferol (vitamin d2), montelukast, prednisone, and sodium, potassium, and magnesium.  Allergies:  Allergies as of 12/08/2021 - Reviewed 12/08/2021 Allergen Reaction Noted  Fenofibrate Muscle Pain    ROS:  A 15 point review of systems was performed and pertinent positives and negatives noted in HPI   Objective:    BP (!) 153/83   Pulse 81   Ht 185.4 cm ('6\' 1"'$ )   Wt (!) 132 kg (291 lb)   BMI 38.39 kg/m   Constitutional :  Alert, cooperative, no distress Lymphatics/Throat:  Supple, no lymphadenopathy Respiratory:  clear to auscultation  bilaterally Cardiovascular:  regular rate and rhythm Gastrointestinal: soft, non-tender; bowel sounds normal; no masses,  no organomegaly. umbilical hernia noted.  moderate, incarcerated, no overlying skin changes, and rare TTP . Diastasis noted as well. Musculoskeletal: Steady gait and movement Skin: Cool and moist, periumbilical surgical scar Psychiatric: Normal affect, non-agitated, not confused     LABS:  N/a   RADS: N/a Assessment:      Recurrent umbilical hernia [[H63.1] pt concerned about strangulation and requesting proceeding with repair, understanding he is higher risk of recurrence at his current weight.  Plan:    1. Recurrent umbilical hernia [[S97.0]  Discussed the risk of surgery including recurrence, which can be up to 50% in the case of incisional or complex hernias, possible use of prosthetic materials (mesh) and the increased risk of mesh infxn if used, bleeding, chronic pain, post-op infxn, post-op SBO or ileus, and possible re-operation to address said risks. The risks of general anesthetic, if used, includes MI, CVA, sudden death or even reaction to anesthetic medications also discussed. Alternatives include continued observation.  Benefits include possible symptom relief, prevention of incarceration, strangulation, enlargement in size over time, and the risk of emergency surgery in the face of strangulation.   Typical post-op recovery time of 3-5 days with 2 weeks of activity restrictions were also discussed.  ED return precautions given for sudden increase in pain, size of hernia with accompanying fever, nausea, and/or vomiting.  The patient verbalized understanding and all questions were answered to the patient's satisfaction.   2. Patient has elected to proceed with surgical treatment. Procedure will  be scheduled. robotic assisted laparoscopic with mesh  labs/images/medications/previous chart entries reviewed personally and relevant changes/updates noted  above.

## 2021-12-16 ENCOUNTER — Other Ambulatory Visit: Payer: Self-pay

## 2021-12-16 ENCOUNTER — Encounter
Admission: RE | Admit: 2021-12-16 | Discharge: 2021-12-16 | Disposition: A | Discharge status: Home / Self Care | Payer: BC Managed Care – PPO | Source: Ambulatory Visit | Attending: Surgery | Admitting: Surgery

## 2021-12-16 NOTE — Patient Instructions (Addendum)
Your procedure is scheduled on: 12/19/21 - Friday Report to the Registration Desk on the 1st floor of the [facility]. To find out your arrival time, please call [phone] between 1PM - 3PM on: 12/18/21 - Thursday If your arrival time is 6:00 am, do not arrive prior to that time as the [facility] entrance doors do not open until 6:00 am.  REMEMBER: Instructions that are not followed completely may result in serious medical risk, up to and including death; or upon the discretion of your surgeon and anesthesiologist your surgery may need to be rescheduled.  Do not eat food after midnight the night before surgery.  No gum chewing, lozengers or hard candies.  You may however, drink CLEAR liquids up to 2 hours before you are scheduled to arrive for your surgery. Do not drink anything within 2 hours of your scheduled arrival time.  Clear liquids include: - water  - apple juice without pulp - gatorade (not RED colors) - black coffee or tea (Do NOT add milk or creamers to the coffee or tea) Do NOT drink anything that is not on this list.  TAKE THESE MEDICATIONS THE MORNING OF SURGERY WITH A SIP OF WATER- NONE  One week prior to surgery: Stop Anti-inflammatories (NSAIDS) such as Advil, Aleve, Ibuprofen, Motrin, Naproxen, Naprosyn and Aspirin based products such as Excedrin, Goodys Powder, BC Powder.  Stop ANY OVER THE COUNTER supplements until after surgery.  You may take Tylenol if needed for pain up until the day of surgery.  No Alcohol for 24 hours before or after surgery.  No Smoking including e-cigarettes for 24 hours prior to surgery.  No chewable tobacco products for at least 6 hours prior to surgery.  No nicotine patches on the day of surgery.  Do not use any "recreational" drugs for at least a week prior to your surgery.  Please be advised that the combination of cocaine and anesthesia may have negative outcomes, up to and including death. If you test positive for  cocaine, your surgery will be cancelled.  On the morning of surgery brush your teeth with toothpaste and water, you may rinse your mouth with mouthwash if you wish. Do not swallow any toothpaste or mouthwash.  Use CHG Soap or wipes as directed on instruction sheet.  Do not wear jewelry, make-up, hairpins, clips or nail polish.  Do not wear lotions, powders, or perfumes.   Do not shave body from the neck down 48 hours prior to surgery just in case you cut yourself which could leave a site for infection.  Also, freshly shaved skin may become irritated if using the CHG soap.  Contact lenses, hearing aids and dentures may not be worn into surgery.  Do not bring valuables to the hospital. [facility] is not responsible for any missing/lost belongings or valuables.   Bring your C-PAP to the hospital with you in case you may have to spend the night.   Notify your doctor if there is any change in your medical condition (cold, fever, infection).  Wear comfortable clothing (specific to your surgery type) to the hospital.  After surgery, you can help prevent lung complications by doing breathing exercises.  Take deep breaths and cough every 1-2 hours. Your doctor may order a device called an Incentive Spirometer to help you take deep breaths. When coughing or sneezing, hold a pillow firmly against your incision with both hands. This is called "splinting." Doing this helps protect your incision. It also decreases belly discomfort.  If you are being admitted to the hospital overnight, leave your suitcase in the car. After surgery it may be brought to your room.  If you are being discharged the day of surgery, you will not be allowed to drive home. You will need a responsible adult (18 years or older) to drive you home and stay with you that night.   If you are taking public transportation, you will need to have a responsible adult (18 years or older) with you. Please confirm with your physician  that it is acceptable to use public transportation.   Please call the [facility] at [phone] if you have any questions about these instructions.  Surgery Visitation Policy:  Patients undergoing a surgery or procedure may have two family members or support persons with them as long as the person is not COVID-19 positive or experiencing its symptoms.   Inpatient Visitation:    Visiting hours are 7 a.m. to 8 p.m. Up to four visitors are allowed at one time in a patient room, including children. The visitors may rotate out with other people during the day. One designated support person (adult) may remain overnight.

## 2021-12-19 ENCOUNTER — Encounter: Payer: Self-pay | Admitting: Surgery

## 2021-12-19 ENCOUNTER — Ambulatory Visit
Admission: RE | Admit: 2021-12-19 | Discharge: 2021-12-19 | Disposition: A | Discharge status: Home / Self Care | Payer: BC Managed Care – PPO | Attending: Surgery | Admitting: Surgery

## 2021-12-19 ENCOUNTER — Encounter
Admission: RE | Disposition: A | Discharge status: Home / Self Care | Payer: Self-pay | Source: Home / Self Care | Attending: Surgery

## 2021-12-19 ENCOUNTER — Ambulatory Visit: Payer: BC Managed Care – PPO | Admitting: Anesthesiology

## 2021-12-19 ENCOUNTER — Other Ambulatory Visit: Payer: Self-pay

## 2021-12-19 DIAGNOSIS — M1990 Unspecified osteoarthritis, unspecified site: Secondary | ICD-10-CM | POA: Insufficient documentation

## 2021-12-19 DIAGNOSIS — Z6838 Body mass index (BMI) 38.0-38.9, adult: Secondary | ICD-10-CM | POA: Diagnosis not present

## 2021-12-19 DIAGNOSIS — E669 Obesity, unspecified: Secondary | ICD-10-CM | POA: Diagnosis not present

## 2021-12-19 DIAGNOSIS — K420 Umbilical hernia with obstruction, without gangrene: Secondary | ICD-10-CM | POA: Diagnosis present

## 2021-12-19 DIAGNOSIS — G4730 Sleep apnea, unspecified: Secondary | ICD-10-CM | POA: Insufficient documentation

## 2021-12-19 DIAGNOSIS — M109 Gout, unspecified: Secondary | ICD-10-CM | POA: Insufficient documentation

## 2021-12-19 HISTORY — PX: INSERTION OF MESH: SHX5868

## 2021-12-19 SURGERY — REPAIR, HERNIA, UMBILICAL, ROBOT-ASSISTED
Anesthesia: General | Site: Abdomen

## 2021-12-19 MED ORDER — DEXTROSE 5 % IV SOLN
INTRAVENOUS | Status: DC | PRN
  Administered 2021-12-19: 3 g via INTRAVENOUS

## 2021-12-19 MED ORDER — DEXAMETHASONE SODIUM PHOSPHATE 10 MG/ML IJ SOLN
INTRAMUSCULAR | Status: DC | PRN
  Administered 2021-12-19: 10 mg via INTRAVENOUS

## 2021-12-19 MED ORDER — PHENYLEPHRINE 80 MCG/ML (10ML) SYRINGE FOR IV PUSH (FOR BLOOD PRESSURE SUPPORT)
PREFILLED_SYRINGE | INTRAVENOUS | Status: DC | PRN
  Administered 2021-12-19: 80 ug via INTRAVENOUS
  Administered 2021-12-19: 120 ug via INTRAVENOUS
  Administered 2021-12-19: 80 ug via INTRAVENOUS
  Administered 2021-12-19: 160 ug via INTRAVENOUS
  Administered 2021-12-19 (×2): 80 ug via INTRAVENOUS

## 2021-12-19 MED ORDER — MIDAZOLAM HCL 2 MG/2ML IJ SOLN
INTRAMUSCULAR | Status: DC | PRN
  Administered 2021-12-19: 2 mg via INTRAVENOUS

## 2021-12-19 MED ORDER — OXYCODONE HCL 5 MG PO TABS
ORAL_TABLET | ORAL | Status: AC
  Filled 2021-12-19: qty 1

## 2021-12-19 MED ORDER — IBUPROFEN 800 MG PO TABS: 800.0000 mg | ORAL_TABLET | Freq: Three times a day (TID) | ORAL | 0 refills | Status: AC | PRN

## 2021-12-19 MED ORDER — GABAPENTIN 300 MG PO CAPS
ORAL_CAPSULE | ORAL | Status: AC
  Filled 2021-12-19: qty 1

## 2021-12-19 MED ORDER — CHLORHEXIDINE GLUCONATE 0.12 % MT SOLN
OROMUCOSAL | Status: AC
  Filled 2021-12-19: qty 15

## 2021-12-19 MED ORDER — ACETAMINOPHEN 325 MG PO TABS: 650.0000 mg | ORAL_TABLET | Freq: Three times a day (TID) | ORAL | 0 refills | Status: AC | PRN

## 2021-12-19 MED ORDER — SUGAMMADEX SODIUM 500 MG/5ML IV SOLN
INTRAVENOUS | Status: AC
  Filled 2021-12-19: qty 5

## 2021-12-19 MED ORDER — DOCUSATE SODIUM 100 MG PO CAPS: 100.0000 mg | ORAL_CAPSULE | Freq: Two times a day (BID) | ORAL | 0 refills | Status: AC | PRN

## 2021-12-19 MED ORDER — EPHEDRINE 5 MG/ML INJ
INTRAVENOUS | Status: AC
  Filled 2021-12-19: qty 5

## 2021-12-19 MED ORDER — GABAPENTIN 300 MG PO CAPS
300.0000 mg | ORAL_CAPSULE | ORAL | Status: AC
  Administered 2021-12-19: 300 mg via ORAL

## 2021-12-19 MED ORDER — CELECOXIB 200 MG PO CAPS
200.0000 mg | ORAL_CAPSULE | ORAL | Status: AC
  Administered 2021-12-19: 200 mg via ORAL

## 2021-12-19 MED ORDER — FENTANYL CITRATE (PF) 100 MCG/2ML IJ SOLN
INTRAMUSCULAR | Status: DC | PRN
  Administered 2021-12-19: 50 ug via INTRAVENOUS
  Administered 2021-12-19: 100 ug via INTRAVENOUS

## 2021-12-19 MED ORDER — CELECOXIB 200 MG PO CAPS
ORAL_CAPSULE | ORAL | Status: AC
  Filled 2021-12-19: qty 1

## 2021-12-19 MED ORDER — 0.9 % SODIUM CHLORIDE (POUR BTL) OPTIME
TOPICAL | Status: DC | PRN
  Administered 2021-12-19: 500 mL

## 2021-12-19 MED ORDER — ONDANSETRON HCL 4 MG/2ML IJ SOLN
4.0000 mg | Freq: Once | INTRAMUSCULAR | Status: DC | PRN
Start: 2021-12-19 — End: 2021-12-19

## 2021-12-19 MED ORDER — SODIUM CHLORIDE (PF) 0.9 % IJ SOLN
INTRAMUSCULAR | Status: AC
  Filled 2021-12-19: qty 50

## 2021-12-19 MED ORDER — CEFAZOLIN SODIUM-DEXTROSE 2-4 GM/100ML-% IV SOLN: 2.0000 g | INTRAVENOUS | Status: DC

## 2021-12-19 MED ORDER — ONDANSETRON HCL 4 MG/2ML IJ SOLN
INTRAMUSCULAR | Status: DC | PRN
  Administered 2021-12-19: 4 mg via INTRAVENOUS

## 2021-12-19 MED ORDER — SUGAMMADEX SODIUM 500 MG/5ML IV SOLN
INTRAVENOUS | Status: DC | PRN
  Administered 2021-12-19: 500 mg via INTRAVENOUS

## 2021-12-19 MED ORDER — LACTATED RINGERS IV SOLN: INTRAVENOUS | Status: DC

## 2021-12-19 MED ORDER — PROPOFOL 10 MG/ML IV BOLUS
INTRAVENOUS | Status: AC
  Filled 2021-12-19: qty 20

## 2021-12-19 MED ORDER — OXYCODONE-ACETAMINOPHEN 5-325 MG PO TABS: 1.0000 | ORAL_TABLET | Freq: Three times a day (TID) | ORAL | 0 refills | Status: AC | PRN

## 2021-12-19 MED ORDER — CHLORHEXIDINE GLUCONATE 0.12 % MT SOLN
15.0000 mL | Freq: Once | OROMUCOSAL | Status: AC
  Administered 2021-12-19: 15 mL via OROMUCOSAL

## 2021-12-19 MED ORDER — BUPIVACAINE-EPINEPHRINE 0.5% -1:200000 IJ SOLN
INTRAMUSCULAR | Status: DC | PRN
  Administered 2021-12-19: 15 mL

## 2021-12-19 MED ORDER — LIDOCAINE HCL (CARDIAC) PF 100 MG/5ML IV SOSY
PREFILLED_SYRINGE | INTRAVENOUS | Status: DC | PRN
  Administered 2021-12-19: 100 mg via INTRAVENOUS

## 2021-12-19 MED ORDER — BUPIVACAINE LIPOSOME 1.3 % IJ SUSP
INTRAMUSCULAR | Status: AC
  Filled 2021-12-19: qty 20

## 2021-12-19 MED ORDER — ONDANSETRON HCL 4 MG/2ML IJ SOLN
INTRAMUSCULAR | Status: AC
  Filled 2021-12-19: qty 2

## 2021-12-19 MED ORDER — ROCURONIUM BROMIDE 100 MG/10ML IV SOLN
INTRAVENOUS | Status: DC | PRN
  Administered 2021-12-19: 10 mg via INTRAVENOUS
  Administered 2021-12-19: 80 mg via INTRAVENOUS
  Administered 2021-12-19: 10 mg via INTRAVENOUS

## 2021-12-19 MED ORDER — OXYCODONE HCL 5 MG/5ML PO SOLN: 5.0000 mg | Freq: Once | ORAL | Status: AC | PRN

## 2021-12-19 MED ORDER — ORAL CARE MOUTH RINSE: 15.0000 mL | Freq: Once | OROMUCOSAL | Status: AC

## 2021-12-19 MED ORDER — ACETAMINOPHEN 500 MG PO TABS
1000.0000 mg | ORAL_TABLET | ORAL | Status: AC
  Administered 2021-12-19: 1000 mg via ORAL

## 2021-12-19 MED ORDER — FENTANYL CITRATE (PF) 100 MCG/2ML IJ SOLN
INTRAMUSCULAR | Status: AC
  Filled 2021-12-19: qty 2

## 2021-12-19 MED ORDER — OXYCODONE HCL 5 MG PO TABS
5.0000 mg | ORAL_TABLET | Freq: Once | ORAL | Status: AC | PRN
  Administered 2021-12-19: 5 mg via ORAL

## 2021-12-19 MED ORDER — MIDAZOLAM HCL 2 MG/2ML IJ SOLN
INTRAMUSCULAR | Status: AC
  Filled 2021-12-19: qty 2

## 2021-12-19 MED ORDER — SODIUM CHLORIDE (PF) 0.9 % IJ SOLN
INTRAMUSCULAR | Status: DC | PRN
  Administered 2021-12-19: 85 mL via INTRAMUSCULAR

## 2021-12-19 MED ORDER — PHENYLEPHRINE HCL-NACL 20-0.9 MG/250ML-% IV SOLN
INTRAVENOUS | Status: DC | PRN
  Administered 2021-12-19: 45 ug/min via INTRAVENOUS

## 2021-12-19 MED ORDER — PROPOFOL 10 MG/ML IV BOLUS
INTRAVENOUS | Status: DC | PRN
  Administered 2021-12-19: 200 mg via INTRAVENOUS

## 2021-12-19 MED ORDER — ACETAMINOPHEN 500 MG PO TABS
ORAL_TABLET | ORAL | Status: AC
  Filled 2021-12-19: qty 2

## 2021-12-19 MED ORDER — DEXAMETHASONE SODIUM PHOSPHATE 10 MG/ML IJ SOLN
INTRAMUSCULAR | Status: AC
  Filled 2021-12-19: qty 1

## 2021-12-19 MED ORDER — LIDOCAINE HCL (PF) 2 % IJ SOLN
INTRAMUSCULAR | Status: AC
  Filled 2021-12-19: qty 5

## 2021-12-19 MED ORDER — CEFAZOLIN SODIUM-DEXTROSE 2-4 GM/100ML-% IV SOLN
INTRAVENOUS | Status: AC
  Filled 2021-12-19: qty 100

## 2021-12-19 MED ORDER — DEXMEDETOMIDINE (PRECEDEX) IN NS 20 MCG/5ML (4 MCG/ML) IV SYRINGE
PREFILLED_SYRINGE | INTRAVENOUS | Status: DC | PRN
  Administered 2021-12-19 (×2): 4 ug via INTRAVENOUS

## 2021-12-19 MED ORDER — ROCURONIUM BROMIDE 10 MG/ML (PF) SYRINGE
PREFILLED_SYRINGE | INTRAVENOUS | Status: AC
  Filled 2021-12-19: qty 10

## 2021-12-19 MED ORDER — BUPIVACAINE-EPINEPHRINE (PF) 0.5% -1:200000 IJ SOLN
INTRAMUSCULAR | Status: AC
  Filled 2021-12-19: qty 30

## 2021-12-19 MED ORDER — CHLORHEXIDINE GLUCONATE CLOTH 2 % EX PADS
6.0000 | MEDICATED_PAD | Freq: Once | CUTANEOUS | Status: AC
  Administered 2021-12-19: 6 via TOPICAL

## 2021-12-19 MED ORDER — FAMOTIDINE 20 MG PO TABS: 20.0000 mg | ORAL_TABLET | Freq: Once | ORAL | Status: DC

## 2021-12-19 MED ORDER — FENTANYL CITRATE (PF) 100 MCG/2ML IJ SOLN: 25.0000 ug | INTRAMUSCULAR | Status: DC | PRN

## 2021-12-19 SURGICAL SUPPLY — 58 items
BLADE SURG SZ11 CARB STEEL (BLADE) ×3 IMPLANT
CANNULA REDUC XI 12-8 STAPL (CANNULA) ×3
CANNULA REDUCER 12-8 DVNC XI (CANNULA) ×2 IMPLANT
COVER TIP SHEARS 8 DVNC (MISCELLANEOUS) ×2 IMPLANT
COVER TIP SHEARS 8MM DA VINCI (MISCELLANEOUS) ×3
DERMABOND ADVANCED (GAUZE/BANDAGES/DRESSINGS) ×1
DERMABOND ADVANCED .7 DNX12 (GAUZE/BANDAGES/DRESSINGS) ×2 IMPLANT
DRAPE 3/4 80X56 (DRAPES) ×3 IMPLANT
DRAPE ARM DVNC X/XI (DISPOSABLE) ×6 IMPLANT
DRAPE COLUMN DVNC XI (DISPOSABLE) ×2 IMPLANT
DRAPE DA VINCI XI ARM (DISPOSABLE) ×9
DRAPE DA VINCI XI COLUMN (DISPOSABLE) ×3
ELECT CAUTERY BLADE 6.4 (BLADE) ×3 IMPLANT
ELECT REM PT RETURN 9FT ADLT (ELECTROSURGICAL) ×3
ELECTRODE REM PT RTRN 9FT ADLT (ELECTROSURGICAL) ×2 IMPLANT
GLOVE BIOGEL PI IND STRL 7.0 (GLOVE) ×4 IMPLANT
GLOVE BIOGEL PI INDICATOR 7.0 (GLOVE) ×2
GLOVE SURG SYN 6.5 ES PF (GLOVE) ×6 IMPLANT
GLOVE SURG SYN 6.5 PF PI (GLOVE) ×4 IMPLANT
GOWN STRL REUS W/ TWL LRG LVL3 (GOWN DISPOSABLE) ×6 IMPLANT
GOWN STRL REUS W/TWL LRG LVL3 (GOWN DISPOSABLE) ×9
GRASPER SUT TROCAR 14GX15 (MISCELLANEOUS) IMPLANT
IRRIGATOR SUCT 8 DISP DVNC XI (IRRIGATION / IRRIGATOR) IMPLANT
IRRIGATOR SUCTION 8MM XI DISP (IRRIGATION / IRRIGATOR)
IV NS 1000ML (IV SOLUTION)
IV NS 1000ML BAXH (IV SOLUTION) IMPLANT
LABEL OR SOLS (LABEL) ×3 IMPLANT
MANIFOLD NEPTUNE II (INSTRUMENTS) ×3 IMPLANT
MESH PROGRIP HERNIA FLAT 15X15 (Mesh General) ×1 IMPLANT
NDL INSUFFLATION 14GA 120MM (NEEDLE) ×2 IMPLANT
NEEDLE HYPO 22GX1.5 SAFETY (NEEDLE) ×3 IMPLANT
NEEDLE INSUFFLATION 14GA 120MM (NEEDLE) ×3 IMPLANT
NS IRRIG 500ML POUR BTL (IV SOLUTION) ×1 IMPLANT
OBTURATOR OPTICAL STANDARD 8MM (TROCAR) ×3
OBTURATOR OPTICAL STND 8 DVNC (TROCAR) ×2
OBTURATOR OPTICALSTD 8 DVNC (TROCAR) ×2 IMPLANT
PACK LAP CHOLECYSTECTOMY (MISCELLANEOUS) ×3 IMPLANT
PENCIL SMOKE EVACUATOR (MISCELLANEOUS) ×3 IMPLANT
SEAL CANN UNIV 5-8 DVNC XI (MISCELLANEOUS) ×4 IMPLANT
SEAL XI 5MM-8MM UNIVERSAL (MISCELLANEOUS) ×6
SET TUBE SMOKE EVAC HIGH FLOW (TUBING) ×3 IMPLANT
SOLUTION ELECTROLUBE (MISCELLANEOUS) ×3 IMPLANT
STAPLER CANNULA SEAL DVNC XI (STAPLE) ×2 IMPLANT
STAPLER CANNULA SEAL XI (STAPLE) ×3
SUT MNCRL AB 4-0 PS2 18 (SUTURE) ×3 IMPLANT
SUT STRATAFIX 0 PDS+ CT-2 23 (SUTURE) ×3
SUT V-LOC 90 ABS DVC 3-0 CL (SUTURE) ×4 IMPLANT
SUT VIC AB 3-0 SH 27 (SUTURE)
SUT VIC AB 3-0 SH 27X BRD (SUTURE) ×2 IMPLANT
SUT VICRYL 0 AB UR-6 (SUTURE) ×4 IMPLANT
SUT VLOC 90 6 CV-15 VIOLET (SUTURE) ×1 IMPLANT
SUTURE STRATFX 0 PDS+ CT-2 23 (SUTURE) ×2 IMPLANT
SYR 30ML LL (SYRINGE) ×3 IMPLANT
SYSTEM WECK SHIELD CLOSURE (TROCAR) ×1 IMPLANT
TRAP FLUID SMOKE EVACUATOR (MISCELLANEOUS) ×3 IMPLANT
TRAY FOLEY MTR SLVR 16FR STAT (SET/KITS/TRAYS/PACK) ×3 IMPLANT
TROCAR XCEL NON-BLD 5MMX100MML (ENDOMECHANICALS) IMPLANT
WATER STERILE IRR 500ML POUR (IV SOLUTION) ×2 IMPLANT

## 2021-12-19 NOTE — Transfer of Care (Signed)
Immediate Anesthesia Transfer of Care Note  Patient: Albert Fisher  Procedure(s) Performed: XI ROBOT ASSISTED UMBILICAL HERNIA REPAIR (Abdomen) INSERTION OF MESH (Abdomen)  Patient Location: PACU  Anesthesia Type:General  Level of Consciousness: awake and patient cooperative  Airway & Oxygen Therapy: Patient Spontanous Breathing and Patient connected to face mask oxygen  Post-op Assessment: Report given to RN and Post -op Vital signs reviewed and stable  Post vital signs: Reviewed and stable  Last Vitals:  Vitals Value Taken Time  BP 106/58 12/19/21 1221  Temp    Pulse 67 12/19/21 1222  Resp 20 12/19/21 1222  SpO2 99 % 12/19/21 1222    Last Pain:  Vitals:   12/19/21 1007  TempSrc: Temporal         Complications: No notable events documented.

## 2021-12-19 NOTE — Discharge Instructions (Addendum)
Hernia repair, Care After This sheet gives you information about how to care for yourself after your procedure. Your health care provider may also give you more specific instructions. If you have problems or questions, contact your health care provider. What can I expect after the procedure? After your procedure, it is common to have the following: Pain in your abdomen, especially in the incision areas. You will be given medicine to control the pain. Tiredness. This is a normal part of the recovery process. Your energy level will return to normal over the next several weeks. Changes in your bowel movements, such as constipation or needing to go more often. Talk with your health care provider about how to manage this. Follow these instructions at home: Medicines  tylenol and advil as needed for discomfort.  Please alternate between the two every four hours as needed for pain.    Use narcotics, if prescribed, only when tylenol and motrin is not enough to control pain.  325-650mg every 8hrs to max of 3000mg/24hrs (including the 325mg in every norco dose) for the tylenol.    Advil up to 800mg per dose every 8hrs as needed for pain.   PLEASE RECORD NUMBER OF PILLS TAKEN UNTIL NEXT FOLLOW UP APPT.  THIS WILL HELP DETERMINE HOW READY YOU ARE TO BE RELEASED FROM ANY ACTIVITY RESTRICTIONS Do not drive or use heavy machinery while taking prescription pain medicine. Do not drink alcohol while taking prescription pain medicine.  Incision care    Follow instructions from your health care provider about how to take care of your incision areas. Make sure you: Keep your incisions clean and dry. Wash your hands with soap and water before and after applying medicine to the areas, and before and after changing your bandage (dressing). If soap and water are not available, use hand sanitizer. Change your dressing as told by your health care provider. Leave stitches (sutures), skin glue, or adhesive strips in  place. These skin closures may need to stay in place for 2 weeks or longer. If adhesive strip edges start to loosen and curl up, you may trim the loose edges. Do not remove adhesive strips completely unless your health care provider tells you to do that. Do not wear tight clothing over the incisions. Tight clothing may rub and irritate the incision areas, which may cause the incisions to open. Do not take baths, swim, or use a hot tub until your health care provider approves. OK TO SHOWER IN 24HRS.   Check your incision area every day for signs of infection. Check for: More redness, swelling, or pain. More fluid or blood. Warmth. Pus or a bad smell. Activity Avoid lifting anything that is heavier than 10 lb (4.5 kg) for 2 weeks or until your health care provider says it is okay. No pushing/pulling greater than 30lbs You may resume normal activities as told by your health care provider. Ask your health care provider what activities are safe for you. Take rest breaks during the day as needed. Eating and drinking Follow instructions from your health care provider about what you can eat after surgery. To prevent or treat constipation while you are taking prescription pain medicine, your health care provider may recommend that you: Drink enough fluid to keep your urine clear or pale yellow. Take over-the-counter or prescription medicines. Eat foods that are high in fiber, such as fresh fruits and vegetables, whole grains, and beans. Limit foods that are high in fat and processed sugars, such as fried and   sweet foods. General instructions Ask your health care provider when you will need an appointment to get your sutures or staples removed. Keep all follow-up visits as told by your health care provider. This is important. Contact a health care provider if: You have more redness, swelling, or pain around your incisions. You have more fluid or blood coming from the incisions. Your incisions feel  warm to the touch. You have pus or a bad smell coming from your incisions or your dressing. You have a fever. You have an incision that breaks open (edges not staying together) after sutures or staples have been removed. You develop a rash. You have chest pain or difficulty breathing. You have pain or swelling in your legs. You feel light-headed or you faint. Your abdomen swells (becomes distended). You have nausea or vomiting. You have blood in your stool (feces). This information is not intended to replace advice given to you by your health care provider. Make sure you discuss any questions you have with your health care provider. Document Released: 11/14/2004 Document Revised: 01/14/2018 Document Reviewed: 01/27/2016 Elsevier Interactive Patient Education  2019 [employer]   AMBULATORY SURGERY  DISCHARGE INSTRUCTIONS   The drugs that you were given will stay in your system until tomorrow so for the next 24 hours you should not:  Drive an automobile Make any legal decisions Drink any alcoholic beverage   You may resume regular meals tomorrow.  Today it is better to start with liquids and gradually work up to solid foods.  You may eat anything you prefer, but it is better to start with liquids, then soup and crackers, and gradually work up to solid foods.   Please notify your doctor immediately if you have any unusual bleeding, trouble breathing, redness and pain at the surgery site, drainage, fever, or pain not relieved by medication.    Additional Instructions:        Please contact your physician with any problems or Same Day Surgery at [phone], Monday through Friday 6 am to 4 pm, or [facility] Main number at [phone].  

## 2021-12-19 NOTE — Anesthesia Procedure Notes (Signed)
Procedure Name: Intubation Date/Time: 12/19/2021 10:34 AM  Performed by: Loletha Grayer, CRNAPre-anesthesia Checklist: Patient identified, Patient being monitored, Timeout performed, Emergency Drugs available and Suction available Patient Re-evaluated:Patient Re-evaluated prior to induction Oxygen Delivery Method: Circle system utilized Preoxygenation: Pre-oxygenation with 100% oxygen Induction Type: IV induction Ventilation: Two handed mask ventilation required and Oral airway inserted - appropriate to patient size Laryngoscope Size: McGraph and 4 Grade View: Grade I Tube type: Oral Tube size: 7.5 mm Number of attempts: 1 Airway Equipment and Method: Stylet Placement Confirmation: ETT inserted through vocal cords under direct vision, positive ETCO2 and breath sounds checked- equal and bilateral Secured at: 22 cm Tube secured with: Tape Dental Injury: Teeth and Oropharynx as per pre-operative assessment

## 2021-12-19 NOTE — Anesthesia Preprocedure Evaluation (Addendum)
Anesthesia Evaluation  Patient identified by MRN, date of birth, ID band Patient awake    Reviewed: Allergy & Precautions, NPO status , Patient's Chart, lab work & pertinent test results  History of Anesthesia Complications Negative for: history of anesthetic complications  Airway Mallampati: II  TM Distance: >3 FB Neck ROM: Full    Dental no notable dental hx. (+) Teeth Intact   Pulmonary neg pulmonary ROS, sleep apnea and Continuous Positive Airway Pressure Ventilation , neg COPD, Patient abstained from smoking.Not current smoker,    Pulmonary exam normal breath sounds clear to auscultation       Cardiovascular Exercise Tolerance: Good METS(-) hypertension(-) CAD and (-) Past MI negative cardio ROS  (-) dysrhythmias  Rhythm:Regular Rate:Normal - Systolic murmurs    Neuro/Psych negative neurological ROS  negative psych ROS   GI/Hepatic neg GERD  ,(+)     (-) substance abuse  ,   Endo/Other  neg diabetes  Renal/GU negative Renal ROS     Musculoskeletal  (+) Arthritis ,   Abdominal (+) + obese,   Peds  Hematology   Anesthesia Other Findings Past Medical History: No date: Gout No date: Hepatic steatosis No date: Hyperlipidemia No date: Obesity No date: Osteoarthritis No date: Sleep apnea No date: Vitamin D deficiency  Reproductive/Obstetrics                            Anesthesia Physical Anesthesia Plan  ASA: 2  Anesthesia Plan: General   Post-op Pain Management:    Induction: Intravenous  PONV Risk Score and Plan: 2 and Ondansetron and Dexamethasone  Airway Management Planned: Oral ETT  Additional Equipment: None  Intra-op Plan:   Post-operative Plan: Extubation in OR  Informed Consent: I have reviewed the patients History and Physical, chart, labs and discussed the procedure including the risks, benefits and alternatives for the proposed anesthesia with the patient  or authorized representative who has indicated his/her understanding and acceptance.     Dental advisory given  Plan Discussed with: CRNA and Surgeon  Anesthesia Plan Comments: (Discussed risks of anesthesia with patient, including PONV, sore throat, lip/dental/eye damage. Rare risks discussed as well, such as cardiorespiratory and neurological sequelae, and allergic reactions. Discussed the role of CRNA in patient's perioperative care. Patient understands.)        Anesthesia Quick Evaluation

## 2021-12-19 NOTE — Op Note (Signed)
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 Tension free repair achieved with ProGrip mesh and suture 5. Adequate hemostasis  Description of procedure: The patient was brought to the operating room and general anesthesia was induced. A time-out was completed verifying correct patient, procedure, site, positioning, and implant(s) and/or special equipment prior to beginning this procedure. Antibiotics were administered prior to making the incision. SCDs placed. The anterior abdominal wall was prepped and draped in the standard sterile fashion.   Palmer's point chosen for entry.  Veress needle placed and abdomen insufflated to 15cm without any dramatic increase in pressure.  Needle removed and optiview technique used to place 560mport at same point.  No injury noted during placement. 3 additional ports, 52m29m2 and 27m66mlong left lateral aspect placed.   Xi robot then docked into place.  umbilical hernia defect measuring 2cm x 2cm was noted.  Preperitoneal plane was entered by making a incision along the right lateral aspect of the peritoneum.  This flap was carried across the abdomen to the other side of the defect, reducing all hernia contents and preperitoneal lipoma within the hernia defect.  Small amounts of bleeding was controlled with cautery.  Once adequate exposure of the defect and adequate space was created to place the mesh, insufflation dropped to 52mm 24m transfacial suture with 0 stratafix used to primarily close defect under minimal tension.  Overlying skin was tacked with suture to secure umbilical stalk to fascia.    ProGrip mesh cut to size with adequate overlap around the defect edges was placed within the abdominal cavity through 27mm 89m and secured to the abdominal wall centered over the defect. The peritoneal flap was then closed with a running 3-0 V lock.  Robot was undocked.  The 27mm c59mla was removed and port site was closed using Efx shield device and 0 vicryl suture, ensuring no bowels were injured during this process.  Abdomen then desufflated while camera within abdomen to ensure no signs of new bleed prior to removing camera and rest of ports completely.  All skin incisions closed with runninrg 4-0 Monocryl in a subcuticular fashion.  All wounds then dressed with Dermabond.  Patient was then successfully awakened and transferred to PACU in stable condition.  At the end of the procedure sponge and instrument counts were correct.

## 2021-12-19 NOTE — Interval H&P Note (Signed)
No change. OK to proceed.

## 2021-12-19 NOTE — Anesthesia Postprocedure Evaluation (Signed)
Anesthesia Post Note  Patient: Albert Fisher  Procedure(s) Performed: XI ROBOT ASSISTED UMBILICAL HERNIA REPAIR (Abdomen) INSERTION OF MESH (Abdomen)  Patient location during evaluation: PACU Anesthesia Type: General Level of consciousness: awake and alert Pain management: pain level controlled Vital Signs Assessment: post-procedure vital signs reviewed and stable Respiratory status: spontaneous breathing, nonlabored ventilation, respiratory function stable and patient connected to nasal cannula oxygen Cardiovascular status: blood pressure returned to baseline and stable Postop Assessment: no apparent nausea or vomiting Anesthetic complications: no   No notable events documented.   Last Vitals:  Vitals:   12/19/21 1252 12/19/21 1308  BP:  125/77  Pulse: 75 67  Resp: (!) 22 20  Temp:  36.6 C  SpO2: 93% 93%    Last Pain:  Vitals:   12/19/21 1308  TempSrc: Temporal  PainSc: 4                  Arita Miss

## 2024-06-01 ENCOUNTER — Other Ambulatory Visit: Payer: Self-pay | Admitting: Internal Medicine

## 2024-06-01 DIAGNOSIS — E1165 Type 2 diabetes mellitus with hyperglycemia: Secondary | ICD-10-CM

## 2024-06-01 DIAGNOSIS — E7849 Other hyperlipidemia: Secondary | ICD-10-CM

## 2024-06-15 ENCOUNTER — Ambulatory Visit
Admission: RE | Admit: 2024-06-15 | Discharge: 2024-06-15 | Disposition: A | Payer: Self-pay | Source: Ambulatory Visit | Attending: Internal Medicine | Admitting: Internal Medicine

## 2024-06-15 DIAGNOSIS — E1165 Type 2 diabetes mellitus with hyperglycemia: Secondary | ICD-10-CM

## 2024-06-15 DIAGNOSIS — E7849 Other hyperlipidemia: Secondary | ICD-10-CM
